# Patient Record
Sex: Female | Born: 1965 | Race: White | Hispanic: No | Marital: Married
Health system: Southern US, Community
[De-identification: ages and names within clinical notes are randomized; demographics above are authoritative.]

---

## 1997-06-12 ENCOUNTER — Other Ambulatory Visit: Admission: RE | Admit: 1997-06-12 | Discharge: 1997-06-12 | Payer: Self-pay | Admitting: Obstetrics and Gynecology

## 1998-10-06 ENCOUNTER — Other Ambulatory Visit: Admission: RE | Admit: 1998-10-06 | Discharge: 1998-10-06 | Payer: Self-pay | Admitting: Obstetrics and Gynecology

## 2000-03-28 ENCOUNTER — Other Ambulatory Visit: Admission: RE | Admit: 2000-03-28 | Discharge: 2000-03-28 | Payer: Self-pay | Admitting: Obstetrics and Gynecology

## 2000-10-16 ENCOUNTER — Emergency Department (HOSPITAL_COMMUNITY): Admission: EM | Admit: 2000-10-16 | Discharge: 2000-10-16 | Payer: Self-pay | Admitting: Emergency Medicine

## 2000-10-31 ENCOUNTER — Observation Stay (HOSPITAL_COMMUNITY): Admission: RE | Admit: 2000-10-31 | Discharge: 2000-11-01 | Payer: Self-pay | Admitting: Surgery

## 2000-10-31 ENCOUNTER — Encounter (INDEPENDENT_AMBULATORY_CARE_PROVIDER_SITE_OTHER): Payer: Self-pay | Admitting: Specialist

## 2001-11-07 ENCOUNTER — Other Ambulatory Visit: Admission: RE | Admit: 2001-11-07 | Discharge: 2001-11-07 | Payer: Self-pay | Admitting: Obstetrics and Gynecology

## 2004-03-18 ENCOUNTER — Other Ambulatory Visit: Admission: RE | Admit: 2004-03-18 | Discharge: 2004-03-18 | Payer: Self-pay | Admitting: Obstetrics and Gynecology

## 2004-12-24 ENCOUNTER — Ambulatory Visit (HOSPITAL_BASED_OUTPATIENT_CLINIC_OR_DEPARTMENT_OTHER): Admission: RE | Admit: 2004-12-24 | Discharge: 2004-12-24 | Payer: Self-pay | Admitting: Surgery

## 2004-12-25 ENCOUNTER — Encounter (INDEPENDENT_AMBULATORY_CARE_PROVIDER_SITE_OTHER): Payer: Self-pay | Admitting: *Deleted

## 2009-01-14 ENCOUNTER — Encounter: Admission: RE | Admit: 2009-01-14 | Discharge: 2009-01-14 | Payer: Self-pay | Admitting: Obstetrics and Gynecology

## 2009-02-27 ENCOUNTER — Ambulatory Visit: Payer: Self-pay | Admitting: Cardiology

## 2009-02-27 DIAGNOSIS — R079 Chest pain, unspecified: Secondary | ICD-10-CM | POA: Insufficient documentation

## 2009-08-06 ENCOUNTER — Encounter: Admission: RE | Admit: 2009-08-06 | Discharge: 2009-08-06 | Payer: Self-pay | Admitting: Obstetrics and Gynecology

## 2010-02-10 NOTE — Assessment & Plan Note (Signed)
Summary: NP6   Visit Type:  Initial Consult Primary Provider:  Martina Sinner   History of Present Illness: Briana Mueller is a delightful 45 year old married white female, wife and the mine, who comes today self referred for chest discomfort.  She's had this for about 5 days. Described as a tightness across her chest. It goes up into her neck. She then had it in her jaw all the other day. It is continuous and not related to exertion. Has been no associated shortness of breath, nausea, diaphoresis, difficulty swallowing, heartburn or indigestion. She denies any neck trauma or chest trauma. She has had some tingling down into her shoulders.  Other than the usual daily stress, she does not relate any increased stress recently. She does not exercise on regular basis.  She has no traditional cardiac risk factors.  She denies any cough, congestion, fever, chills, hemoptysis. She says she may call for little bit at night but is unaware of her problem during the day. Her son just got over a respiratory tract infection. She's had a previous cholecystectomy. She denies any abdominal pain change in bowel habits or blood or melena.  Preventive Screening-Counseling & Management  Alcohol-Tobacco     Alcohol drinks/day: <1     Alcohol type: all     Smoking Status: never  Caffeine-Diet-Exercise     Does Patient Exercise: no     MSH Depression Score: 0  Current Medications (verified): 1)  Birth Control Pill .... Take 1 Tablet By Mouth Once A Day  Allergies (verified): 1)  ! * Eggs  Past History:  Family History: Last updated: 2009-03-23 Father: deceased , rare blood disorder, 35 yo Mother: deceased , HTN Siblings: 2 brothers living both with HTN                2 sisters living good health  MGM-  deceased 47yo, breast CA MGF-deceased 47 yo colon CA PGM- deceased 47 yo, old age PGF- deceased 47 yo, DM  Past Surgical History: Cholecystectomy 2002 sebaceous cyst removal   Family  History: Father: deceased , rare blood disorder, 57 yo Mother: deceased , HTN Siblings: 2 brothers living both with HTN                2 sisters living good health  MGM-  deceased 47yo, breast CA MGF-deceased 47 yo colon CA PGM- deceased 47 yo, old age PGF- deceased 47 yo, DM  Social History: Alcohol drinks/day:  <1 Smoking Status:  never Does Patient Exercise:  no  Review of Systems       negative other than history of present illness  Vital Signs:  Patient profile:   45 year old female Pulse rate:   60 / minute Resp:     16 per minute BP supine:   125 / 72  Physical Exam  General:  Well developed, well nourished, in no acute distress. Head:  normocephalic and atraumatic Eyes:  PERRLA/EOM intact; conjunctiva and lids normal. Mouth:  Teeth, gums and palate normal. Oral mucosa normal. Neck:  Neck supple, no JVD. No masses, thyromegaly or abnormal cervical nodes. Chest Lilu Mcglown:  no deformities or breast masses noted Lungs:  Clear bilaterally to auscultation and percussion. Heart:  Non-displaced PMI, chest non-tender; regular rate and rhythm, S1, S2 without murmurs, rubs or gallops. Carotid upstroke normal, no bruit. Normal abdominal aortic size, no bruits. Femorals normal pulses, no bruits. Pedals normal pulses. No edema, no varicosities. Abdomen:  Bowel sounds positive; abdomen soft and non-tender without  masses, organomegaly, or hernias noted. No hepatosplenomegaly. Msk:  Back normal, normal gait. Muscle strength and tone normal. Pulses:  pulses normal in all 4 extremities Extremities:  No clubbing or cyanosis. Neurologic:  Alert and oriented x 3. Skin:  Intact without lesions or rashes. Psych:  Normal affect.   Problems:  Medical Problems Added: 1)  Dx of Chest Pain-unspecified  (ICD-786.50) 2)  Dx of Chest Pain-unspecified  (ICD-786.50)  EKG  Procedure date:  02/27/2009  Findings:      normal sinus rhythm, ST segment abnormality of the inferior lateral leads,  no PR depression, QRS normal, no ST segment changes otherwise, no T-wave inversion, no low voltage.  Impression & Recommendations:  Problem # 1:  CHEST PAIN-UNSPECIFIED (ICD-786.50) Assessment New I suspect her chest discomfort is due to stress or tension. Her history does not suggest any cardiac calls nor does her physical examination. Her electrocardiogram is nonspecific and I've given her a copy to use as a future reference. I have given her reassurance encouraged her to exercise, this patient is some relaxation therapy, and enjoy her three-day trip coming up to [place]. All questions were answered.  Patient Instructions: 1)  Your physician recommends that you schedule a follow-up appointment in: as needed

## 2010-05-29 NOTE — Op Note (Signed)
[name], Briana Mueller                 ACCOUNT NO.:  [id number]   MEDICAL RECORD NO.:  [id number]          PATIENT TYPE:  AMB   LOCATION:  DSC                          FACILITY:  [facility]   PHYSICIAN:  Currie Paris, M.D.DATE OF BIRTH:  Apr 28, 1965   DATE OF PROCEDURE:  12/24/2004  DATE OF DISCHARGE:                                 OPERATIVE REPORT   PREOPERATIVE DIAGNOSIS:  Epidermoid cyst of right upper outer quadrant of  breast and right axilla (1 cm).   POSTOPERATIVE DIAGNOSIS:  Epidermoid cyst of right upper outer quadrant of  breast and right axilla (1 cm).   OPERATION:  Excision of epidermoid cyst x2.   SURGEON:  Currie Paris, M.D.   ANESTHESIA:  Local.   CLINICAL HISTORY:  [name] has a couple of cystic areas, 1 in low anterior  right axilla and 1 a couple of centimeters lower and medial, which is really  over the breast.  These are both bothered by her bras, as they rub right  across these particular areas and she decided to have these removed.   DESCRIPTION OF PROCEDURE:  In the minor procedure room the patient  identified both areas.  We confirmed the plans for removing them.  They were  marked.  The areas were prepped with some alcohol and anesthetized with 1%  Xylocaine.  They were then prepped with Betadine.  I waited 10 minutes for  the epinephrine to have effect.   Each was removed with a 1-cm elliptical incision and sharply excised intact.  Both incisions were closed with 4-0 Monocryl subcuticular and some  Dermabond.   The patient tolerated the procedure well with no complications.      Currie Paris, M.D.  Electronically Signed     CJS/MEDQ  D:  12/24/2004  T:  12/28/2004  Job:  [id number]

## 2010-05-29 NOTE — Op Note (Signed)
[facility]  Patient:    Briana Mueller, Briana Mueller Visit Number: [id number] MRN: [id number]          Service Type: SUR Location: 4W [id number] 01 Attending Physician:  Charlton Haws Dictated by:   Currie Paris, M.D. Proc. Date: 10/31/00 Admit Date:  10/31/2000 Discharge Date: 11/01/2000   CC:         Carylon Perches, M.D.  Tish Frederickson. Earlene Plater, M.D.   Operative Report  VISIT#:  [id number]  OFFICE MRN#:  [id number]  PREOPERATIVE DIAGNOSIS:  Chronic calculus cholecystitis.  POSTOPERATIVE DIAGNOSIS:  Chronic calculus cholecystitis.  OPERATIVE PROCEDURE:  Laparoscopic cholecystectomy.  SURGEON:  Currie Paris, M.D.  ASSISTANT:  Zigmund Daniel, M.D.  ANESTHESIA:  General endotracheal.  CLINICAL HISTORY:  This patient is a 45 year old woman with a history of episodic abdominal pain and a severe episode which necessitated her visit to the emergency room. She was found to have gallstones.  DESCRIPTION OF PROCEDURE:  The patient was brought to the operating room and after satisfactory general endotracheal anesthesia had been obtained, the abdomen was prepped and draped. We decided to use Marcaine 0.025% at each incision site.  The umbilical incision was made first and the fascia opened and the peritoneal cavity entered under direct vision. Hasson was placed and the abdomen insufflated to 15 mmHg. The abdomen was inspected and there were no gross abnormalities noted. There were a few adhesions around the gallbladder. Three additional trocars were placed in the usual positions and the patient put in reverse Trendelenburg and tilted to the left.  The area of the cystic duct was opened and the perineum cleaned off so we could get good visualization. We followed the cystic duct down to the common bile duct and back up to the gallbladder. I was able to get around this and open the peritoneum on both sides of the gallbladder so I had a nice window in the  triangle of Calot. Once this was identified, I could see the cystic artery lying directly behind the cystic duct, or I had to retract the cystic duct a little bit to visualize it. Once I had this anatomy all confirmed, I put four clips on the cystic duct leaving three on the stay side and divided it. The anterior and posterior branches of the cystic artery were divided with two clips on the stay side. The gallbladder was removed from below to above. There was a little oozing at the very top of the liver bed which was controlled with the cautery and a little Surgicel left behind, but basically looked dry. The gallbladder was brought out the umbilical port. The abdomen was reinsufflated and everything again appeared to be dry. The lateral ports were removed and the umbilical port closed with a pursestring. The abdomen was deflated through the epigastric port. I put a fascial suture at the epigastric port since I could see a rent in the fascia here from the trocar. The skin was closed with 4-0 Monocryl subcuticular plus Steri-Strips. The patient tolerated the procedure well. There were no operative complications. All counts were correct. Dictated by:   Currie Paris, M.D. Attending Physician:  Charlton Haws DD:  10/31/00 TD:  11/01/00 Job: [id number]

## 2013-06-21 ENCOUNTER — Other Ambulatory Visit: Payer: Self-pay | Admitting: Obstetrics and Gynecology

## 2013-06-21 DIAGNOSIS — N6310 Unspecified lump in the right breast, unspecified quadrant: Secondary | ICD-10-CM

## 2013-06-29 ENCOUNTER — Encounter (INDEPENDENT_AMBULATORY_CARE_PROVIDER_SITE_OTHER): Payer: Self-pay

## 2013-06-29 ENCOUNTER — Ambulatory Visit
Admission: RE | Admit: 2013-06-29 | Discharge: 2013-06-29 | Disposition: A | Discharge status: Home / Self Care | Payer: BC Managed Care – PPO | Source: Ambulatory Visit | Attending: Obstetrics and Gynecology | Admitting: Obstetrics and Gynecology

## 2013-06-29 DIAGNOSIS — N6310 Unspecified lump in the right breast, unspecified quadrant: Secondary | ICD-10-CM

## 2014-08-15 ENCOUNTER — Other Ambulatory Visit: Payer: Self-pay

## 2014-08-15 DIAGNOSIS — Z1231 Encounter for screening mammogram for malignant neoplasm of breast: Secondary | ICD-10-CM

## 2014-08-21 ENCOUNTER — Ambulatory Visit
Admission: RE | Admit: 2014-08-21 | Discharge: 2014-08-21 | Disposition: A | Discharge status: Home / Self Care | Payer: Commercial Managed Care - HMO | Source: Ambulatory Visit

## 2014-08-21 DIAGNOSIS — Z1231 Encounter for screening mammogram for malignant neoplasm of breast: Secondary | ICD-10-CM

## 2014-08-27 ENCOUNTER — Ambulatory Visit: Payer: Self-pay

## 2015-10-09 ENCOUNTER — Other Ambulatory Visit: Payer: Self-pay | Admitting: Obstetrics and Gynecology

## 2015-10-09 DIAGNOSIS — Z1231 Encounter for screening mammogram for malignant neoplasm of breast: Secondary | ICD-10-CM

## 2015-10-17 ENCOUNTER — Ambulatory Visit
Admission: RE | Admit: 2015-10-17 | Discharge: 2015-10-17 | Disposition: A | Discharge status: Home / Self Care | Payer: Commercial Managed Care - PPO | Source: Ambulatory Visit | Attending: Obstetrics and Gynecology | Admitting: Obstetrics and Gynecology

## 2015-10-17 DIAGNOSIS — Z1231 Encounter for screening mammogram for malignant neoplasm of breast: Secondary | ICD-10-CM

## 2015-10-22 ENCOUNTER — Other Ambulatory Visit: Payer: Self-pay | Admitting: Obstetrics and Gynecology

## 2015-10-22 DIAGNOSIS — R928 Other abnormal and inconclusive findings on diagnostic imaging of breast: Secondary | ICD-10-CM

## 2015-10-23 ENCOUNTER — Ambulatory Visit
Admission: RE | Admit: 2015-10-23 | Discharge: 2015-10-23 | Disposition: A | Discharge status: Home / Self Care | Payer: Commercial Managed Care - PPO | Source: Ambulatory Visit | Attending: Obstetrics and Gynecology | Admitting: Obstetrics and Gynecology

## 2015-10-23 DIAGNOSIS — R928 Other abnormal and inconclusive findings on diagnostic imaging of breast: Secondary | ICD-10-CM

## 2015-10-24 ENCOUNTER — Other Ambulatory Visit: Payer: Commercial Managed Care - PPO

## 2018-06-30 ENCOUNTER — Other Ambulatory Visit: Payer: Self-pay | Admitting: Internal Medicine

## 2018-06-30 ENCOUNTER — Other Ambulatory Visit: Payer: Self-pay

## 2018-06-30 DIAGNOSIS — Z20822 Contact with and (suspected) exposure to covid-19: Secondary | ICD-10-CM

## 2018-07-02 LAB — NOVEL CORONAVIRUS, NAA: SARS-CoV-2, NAA: NOT DETECTED
